# Patient Record
Sex: Male | Born: 1985
Health system: Southern US, Community
[De-identification: ages and names within clinical notes are randomized; demographics above are authoritative.]

---
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
## 2006-04-28 ENCOUNTER — Ambulatory Visit: Payer: Self-pay | Admitting: Internal Medicine

## 2008-05-28 ENCOUNTER — Emergency Department (HOSPITAL_COMMUNITY): Admission: EM | Admit: 2008-05-28 | Discharge: 2008-05-28 | Payer: Self-pay | Admitting: Emergency Medicine

## 2009-09-20 ENCOUNTER — Ambulatory Visit: Payer: Self-pay | Admitting: Internal Medicine

## 2009-09-20 DIAGNOSIS — F5089 Other specified eating disorder: Secondary | ICD-10-CM | POA: Insufficient documentation

## 2009-09-20 DIAGNOSIS — M26609 Unspecified temporomandibular joint disorder, unspecified side: Secondary | ICD-10-CM | POA: Insufficient documentation

## 2009-09-26 LAB — CONVERTED CEMR LAB
Basophils Absolute: 0 10*3/uL (ref 0.0–0.1)
Basophils Relative: 0.4 % (ref 0.0–3.0)
Eosinophils Absolute: 0.1 10*3/uL (ref 0.0–0.7)
Eosinophils Relative: 1.5 % (ref 0.0–5.0)
HCT: 42.6 % (ref 39.0–52.0)
Hemoglobin: 14.6 g/dL (ref 13.0–17.0)
Iron: 148 ug/dL (ref 42–165)
Lymphocytes Relative: 32.9 % (ref 12.0–46.0)
Lymphs Abs: 1.8 10*3/uL (ref 0.7–4.0)
MCHC: 34.3 g/dL (ref 30.0–36.0)
MCV: 98.2 fL (ref 78.0–100.0)
Monocytes Absolute: 0.6 10*3/uL (ref 0.1–1.0)
Monocytes Relative: 10.9 % (ref 3.0–12.0)
Neutro Abs: 3 10*3/uL (ref 1.4–7.7)
Neutrophils Relative %: 54.3 % (ref 43.0–77.0)
Platelets: 262 10*3/uL (ref 150.0–400.0)
RBC: 4.34 M/uL (ref 4.22–5.81)
RDW: 12.1 % (ref 11.5–14.6)
Saturation Ratios: 47.4 % (ref 20.0–50.0)
Transferrin: 223.2 mg/dL (ref 212.0–360.0)
WBC: 5.5 10*3/uL (ref 4.5–10.5)

## 2017-03-28 DIAGNOSIS — H6123 Impacted cerumen, bilateral: Secondary | ICD-10-CM | POA: Diagnosis not present

## 2017-03-28 DIAGNOSIS — J029 Acute pharyngitis, unspecified: Secondary | ICD-10-CM | POA: Diagnosis not present

## 2017-06-24 ENCOUNTER — Ambulatory Visit (INDEPENDENT_AMBULATORY_CARE_PROVIDER_SITE_OTHER): Payer: 59 | Admitting: Family Medicine

## 2017-06-24 ENCOUNTER — Encounter: Payer: Self-pay | Admitting: Family Medicine

## 2017-06-24 VITALS — BP 112/58 | Temp 98.0°F | Ht 72.5 in | Wt 182.0 lb

## 2017-06-24 DIAGNOSIS — Z0000 Encounter for general adult medical examination without abnormal findings: Secondary | ICD-10-CM

## 2017-06-24 NOTE — Patient Instructions (Signed)
WE NOW OFFER   [facility] FAST TRACK!!!  SAME DAY Appointments for ACUTE CARE  Such as: Sprains, Injuries, cuts, abrasions, rashes, muscle pain, joint pain, back pain Colds, flu, sore throats, headache, allergies, cough, fever  Ear pain, sinus and eye infections Abdominal pain, nausea, vomiting, diarrhea, upset stomach Animal/insect bites  3 Easy Ways to Schedule: Walk-In Scheduling Call in scheduling Mychart Sign-up: [email or web address]         

## 2017-06-24 NOTE — Progress Notes (Signed)
   Subjective:    Patient ID: Paul Strong, male    DOB: 1986-08-01, 31 y.o.   MRN: [id number]  HPI 31 yr old male to establish and for a well exam. He feels great. 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    Review of Systems  Constitutional: Negative.   HENT: Negative.   Eyes: Negative.   Respiratory: Negative.   Cardiovascular: Negative.   Gastrointestinal: Negative.   Genitourinary: Negative.   Musculoskeletal: Negative.   Skin: Negative.   Neurological: Negative.   Psychiatric/Behavioral: Negative.        Objective:   Physical Exam  Constitutional: He is oriented to person, place, and time. He appears well-developed and well-nourished. No distress.  HENT:  Head: Normocephalic and atraumatic.  Right Ear: External ear normal.  Left Ear: External ear normal.  Nose: Nose normal.  Mouth/Throat: Oropharynx is clear and moist. No oropharyngeal exudate.  Eyes: Pupils are equal, round, and reactive to light. Conjunctivae and EOM are normal. Right eye exhibits no discharge. Left eye exhibits no discharge. No scleral icterus.  Neck: Neck supple. No JVD present. No tracheal deviation present. No thyromegaly present.  Cardiovascular: Normal rate, regular rhythm, normal heart sounds and intact distal pulses.  Exam reveals no gallop and no friction rub.   No murmur heard. Pulmonary/Chest: Effort normal and breath sounds normal. No respiratory distress. He has no wheezes. He has no rales. He exhibits no tenderness.  Abdominal: Soft. Bowel sounds are normal. He exhibits no distension and no mass. There is no tenderness. There is no rebound and no guarding.  Genitourinary: Rectum normal, prostate normal and penis normal. Rectal exam shows guaiac negative stool. No penile tenderness.  Musculoskeletal: Normal range of motion. He exhibits no edema or tenderness.  Lymphadenopathy:    He has no cervical adenopathy.  Neurological: He is alert and oriented to person,  place, and time. He has normal reflexes. No cranial nerve deficit. He exhibits normal muscle tone. Coordination normal.  Skin: Skin is warm and dry. No rash noted. He is not diaphoretic. No erythema. No pallor.  Psychiatric: He has a normal mood and affect. His behavior is normal. Judgment and thought content normal.          Assessment & Plan:  Well exam. We discussed diet and exercise. Set up fasting labs soon.  Gershon Crane, MD

## 2017-09-02 ENCOUNTER — Other Ambulatory Visit (INDEPENDENT_AMBULATORY_CARE_PROVIDER_SITE_OTHER): Payer: 59

## 2017-09-02 DIAGNOSIS — Z0000 Encounter for general adult medical examination without abnormal findings: Secondary | ICD-10-CM

## 2017-09-02 LAB — CBC WITH DIFFERENTIAL/PLATELET
BASOS ABS: 0 10*3/uL (ref 0.0–0.1)
Basophils Relative: 0.6 % (ref 0.0–3.0)
Eosinophils Absolute: 0.1 10*3/uL (ref 0.0–0.7)
Eosinophils Relative: 1.4 % (ref 0.0–5.0)
HCT: 44.7 % (ref 39.0–52.0)
Hemoglobin: 15 g/dL (ref 13.0–17.0)
LYMPHS ABS: 1.5 10*3/uL (ref 0.7–4.0)
Lymphocytes Relative: 26.4 % (ref 12.0–46.0)
MCHC: 33.6 g/dL (ref 30.0–36.0)
MCV: 95.1 fl (ref 78.0–100.0)
MONOS PCT: 11.5 % (ref 3.0–12.0)
Monocytes Absolute: 0.6 10*3/uL (ref 0.1–1.0)
NEUTROS PCT: 60.1 % (ref 43.0–77.0)
Neutro Abs: 3.4 10*3/uL (ref 1.4–7.7)
Platelets: 312 10*3/uL (ref 150.0–400.0)
RBC: 4.7 Mil/uL (ref 4.22–5.81)
RDW: 12.9 % (ref 11.5–15.5)
WBC: 5.7 10*3/uL (ref 4.0–10.5)

## 2017-09-02 LAB — POC URINALSYSI DIPSTICK (AUTOMATED)
BILIRUBIN UA: NEGATIVE
Blood, UA: NEGATIVE
Glucose, UA: NEGATIVE
Ketones, UA: NEGATIVE
LEUKOCYTES UA: NEGATIVE
NITRITE UA: NEGATIVE
PH UA: 6 (ref 5.0–8.0)
PROTEIN UA: NEGATIVE
Spec Grav, UA: 1.025 (ref 1.010–1.025)
Urobilinogen, UA: 0.2 E.U./dL

## 2017-09-02 LAB — HEPATIC FUNCTION PANEL
ALBUMIN: 4.4 g/dL (ref 3.5–5.2)
ALT: 34 U/L (ref 0–53)
AST: 29 U/L (ref 0–37)
Alkaline Phosphatase: 55 U/L (ref 39–117)
BILIRUBIN DIRECT: 0.1 mg/dL (ref 0.0–0.3)
BILIRUBIN TOTAL: 0.5 mg/dL (ref 0.2–1.2)
Total Protein: 7 g/dL (ref 6.0–8.3)

## 2017-09-02 LAB — BASIC METABOLIC PANEL
BUN: 21 mg/dL (ref 6–23)
CALCIUM: 9.9 mg/dL (ref 8.4–10.5)
CHLORIDE: 103 meq/L (ref 96–112)
CO2: 29 mEq/L (ref 19–32)
CREATININE: 1.08 mg/dL (ref 0.40–1.50)
GFR: 85.15 mL/min (ref >60.00)
Glucose, Bld: 86 mg/dL (ref 70–99)
Potassium: 4.4 mEq/L (ref 3.5–5.1)
Sodium: 140 mEq/L (ref 135–145)

## 2017-09-02 LAB — LIPID PANEL
CHOL/HDL RATIO: 3
Cholesterol: 162 mg/dL (ref 0–200)
HDL: 58.2 mg/dL (ref >39.00)
LDL CALC: 92 mg/dL (ref 0–99)
NONHDL: 103.55
TRIGLYCERIDES: 57 mg/dL (ref 0.0–149.0)
VLDL: 11.4 mg/dL (ref 0.0–40.0)

## 2017-09-02 LAB — TSH: TSH: 1.03 u[IU]/mL (ref 0.35–4.50)

## 2018-03-31 ENCOUNTER — Encounter: Payer: Self-pay | Admitting: Sports Medicine

## 2018-03-31 ENCOUNTER — Ambulatory Visit: Payer: 59 | Admitting: Sports Medicine

## 2018-03-31 ENCOUNTER — Ambulatory Visit
Admission: RE | Admit: 2018-03-31 | Discharge: 2018-03-31 | Disposition: A | Discharge status: Home / Self Care | Payer: 59 | Source: Ambulatory Visit | Attending: Sports Medicine | Admitting: Sports Medicine

## 2018-03-31 VITALS — BP 132/72 | Ht 72.0 in | Wt 180.0 lb

## 2018-03-31 DIAGNOSIS — M25552 Pain in left hip: Secondary | ICD-10-CM

## 2018-03-31 NOTE — Progress Notes (Signed)
   Subjective:    Patient ID: Paul Strong, male    DOB: Nov 21, 1985, 32 y.o.   MRN: [id number]  HPI Pt normally does squats and lunges as part of his lifting routine. He was squatting 200lbs in an effort to get to 225lbs by summer and thinks he overdid it. No obvious distinct injury but he has been having L groin pain in the crease of his leg for the past 8-10 weeks and has stopped working out as a result due to pain. He has pain with certain mvmts, particularly when he has his left leg forward and is pushing off with his L foot. He also has pain when he squeezes his legs together. No pain radiation to abdomen or testicles or down his leg. No meds or ice tried. No pain currently but 3/10 pain with certain mvmts which is improved from 6/10. He usually weight trains 3x/wk and runs once a week but he has had to take a 2 month break due to pain.    Review of Systems As above    Objective:   Physical Exam  Well-developed, well-nourished. No acute distress.Awake alert and oriented 3. Vital signs reviewed  Left hip: Smooth painless hip range of motion with a negative logroll. Negative Faber. Negative FADIR. Patient is tender to palpation at the origin of the common abductor tendon with reproducible pain with resisted left leg adduction. No palpable defect. Good strength otherwise.Neurovascular intact distally. Walking without a limp.       Assessment & Plan:  L adductor strain with possible partial tear Pt sent for XR L hip and pelvis to rule out bony abnormality. Will call with results.  Given adductor eccentric strengthening exercises with 3 different resistance bands. Told he can try light weight open chain exercises such as leg extensions and hamstring curls. F/u in office here in 1 month. If symptoms persist, consider further diagnostic imaging.  Addendum: X-rays of the left hip and pelvis are unremarkable.

## 2018-04-01 ENCOUNTER — Encounter: Payer: Self-pay | Admitting: Sports Medicine

## 2018-04-28 ENCOUNTER — Ambulatory Visit: Payer: 59 | Admitting: Sports Medicine

## 2018-04-28 ENCOUNTER — Encounter: Payer: Self-pay | Admitting: Sports Medicine

## 2018-04-28 VITALS — BP 146/91 | Ht 73.0 in | Wt 180.0 lb

## 2018-04-28 DIAGNOSIS — M7542 Impingement syndrome of left shoulder: Secondary | ICD-10-CM | POA: Diagnosis not present

## 2018-04-28 DIAGNOSIS — M25512 Pain in left shoulder: Secondary | ICD-10-CM

## 2018-04-28 NOTE — Progress Notes (Signed)
History was provided by the patient.  Paul Strong is a 32 y.o. male who is here for f/u of left hip pain and new left shoulder pain.     HPI:  Paul Strong is here for follow up of left groin pain and for new left shoulder pain. Reprots left groin pain resolved 2-3 weeks after last sports med visit after he followed the exercises as recommended. Would like his left shoulder evaluated. Believes pain started 3 to 4 months ago, perhaps a little before his groin pain started. Denies knowing of a single acute injury to shoulder. Describes as throbbing pain "deep in the joint" which he only feels when he is doing weight training >30lbs: forward presses and lfits and overhead lifting.  No problems with flat bench presses. Is right handed, but his left arm is usually stronger. No numbness, tingling, or weakness in his left arm or hand. No neck pain. No hx of previous shoulder injury. Played lacrosse in high school. Likes to play golf. No treatment tried for current pain. No OTC or other meds.  Patient was last seen in sports medicine on 03/31/2018 where he was having left groin pain after increasing squatting routine.  Was diagnosed with a left hip adductor strain with possible partial tear at that time and given home exercise size program.  Review of systems:  As stated above   Interval past medical history, surgical history, family history, and social history obtained and reviewed.  Patient Active Problem List   Diagnosis Date Noted  . PICA 09/20/2009  . TMJ SYNDROME 09/20/2009    Physical Exam:  BP (!) 146/91   Ht 6\' 1"  (1.854 m)   Wt 180 lb (81.6 kg)   BMI 23.75 kg/m     Physical Exam Physical exam: Vital signs are reviewed and are documented in the chart Gen.: Alert, oriented, appears stated age, in no apparent distress HEENT: Moist oral mucosa Respiratory: Normal respirations, able to speak in full sentences Cardiac: Regular rate, distal pulses 2+ Integumentary: No rashes on  visible skin Neurologic: Rotator cuff strength intact bilaterally and symmetric. Upper extremity strength 5/5 throughout. Sensation intact. Psych: Normal affect, mood is described as good Musculoskeletal: Shoulder exam unremarkable bilaterally.  Inspection of both shoulders reveals no obvious deformity or muscle atrophy, warmth, erythema, ecchymosis, or effusion.  No tenderness to palpation over bicipital groove, rotator cuff insertion, cervical spine, scapular spine, distal clavicle, or AC joint.  Full range of motion in shoulder joints bilaterally. No pain with internal or external rotation. Normal grip strength.  Special tests:  Hawkins: negative  Neers: negative  O'Brian: negative  Cross arm: negative  Yergason's: negative  Speeds: negative  Empty can: negative  No imaging today.  Assessment/Plan: Paul Strong is a [age]yr old healthy male previously seen in sports med for left groin pain, and here today with left shoulder pain without acute injury x 3-4 months. History, location of pain, and current unremarkable shoulder exam are most consistent with mild impingement likely due to muscular imbalance around rotator cuff secondary to weight training. No signs of shoulder instability and rotator cuff tear is unlikely. No imaging required today.  1) Left shoulder pain -recommended exercises for scapular stabilization and rotator cuff strengthening -avoid excessive overhead lifting. Okay to continue weight training in gym but avoid painful movements -ok to take OTC NSAID PRN  2) Left groin pain- Resolved  Follow up: As needed for new or worsening symptoms   Annell GreeningPaige Nhi Butrum, MD, MS [facility] PGY3  Patient seen and evaluated with the resident. I agree with the above plan of care. Groin pain has resolved. Patient now complaining of left shoulder pain which is minimally painful. It is not affecting his activities.Although his physical exam is fairly unremarkable, his history  suggests rotator cuff impingement as the source of his symptoms. Jobe home exercise program and scapular stabilization exercises were provided. Patient may need to avoid overhead lifting for the next several weeks to allow for his pain to resolve. If symptoms persist or worsen he will return to the office for reevaluation and consideration of MSK ultrasound. Follow-up for ongoing or recalcitrant issues.

## 2018-04-28 NOTE — Patient Instructions (Signed)
Thanks for coming to see Dr. Margaretha Sheffieldraper. We recommend the following for your shoulder pain:  -Do exercises with resistance band that we reviewed in clinic -Avoid excessive overhead lifting or activities that increase pain while in the gym -Follow-up in sports medicine as needed for new or worsening symptoms

## 2018-11-05 ENCOUNTER — Ambulatory Visit: Payer: 59 | Admitting: Sports Medicine

## 2018-11-05 ENCOUNTER — Encounter: Payer: Self-pay | Admitting: Sports Medicine

## 2018-11-05 VITALS — BP 132/82 | Ht 72.0 in | Wt 180.0 lb

## 2018-11-05 DIAGNOSIS — M79671 Pain in right foot: Secondary | ICD-10-CM

## 2018-11-05 DIAGNOSIS — M25522 Pain in left elbow: Secondary | ICD-10-CM

## 2018-11-05 NOTE — Progress Notes (Signed)
Paul Strong - 33 y.o. male MRN [id number]  Date of birth: [birth year]    SUBJECTIVE:      Chief Complaint:/ HPI:  Patient presents today with complaint of left elbow pain and right foot pain.  Left elbow pain: 9 months of posterior medial elbow pain.  This began somewhat related to golfing and striking the ground w club.  Since then he has had intermittent pain with pressing movements when fully extending at the elbow and with swinging a golf club.  He does have a remote history of fracture in the area from a snowboarding accident.  He his pain is being sharp and is aggravated.  He denies any associated swelling or erythema.  No radiating numbness tingling or pain into the hand.  He has not taken any medications.  No recent x-rays.  Right foot pain: 6 months of dorsal lateral foot pain after running.  He denies any second previous injuries other than ankle sprains in the past.  He is able to run but notes pain after this.  He reports having flat feet and wears rigid orthotics in his dress shoes for this.  He does not wear orthotics in his running shoes.  He denies any localized erythema or swelling over the lateral foot.  No associated numbness or tingling.  No associated skin changes   ROS:     See HPI  PERTINENT  PMH / PSH FH / / SH:  Past Medical, Surgical, Social, and Family History Reviewed & Updated in the EMR.    OBJECTIVE: BP 132/82   Ht 6' (1.829 m)   Wt 180 lb (81.6 kg)   BMI 24.41 kg/m   Physical Exam:  Vital signs are reviewed.  GEN: Alert and oriented, NAD Pulm: Breathing unlabored PSY: normal mood, congruent affect  MSK: Left elbow: No obvious deformity, swelling, or erythema Mild very focal tenderness over the medial joint/cubital tunnel He does have slightly decreased extension of the elbow compared to the left however he states this is been present since the fracture 5/5 strength with elbow flexion and extension without pain N/V intact distally Negative  Tinel's at the cubital tunnel.  No pain or gapping with milking maneuver  MSK US:  Limited ultrasound evaluation of the left elbow.   No abnormalities over the lateral epicondyle.   No abnormalities over the medial epicondyle.   The distal insertion of the triceps is unremarkable.   The UCL is intact without tears.  Ulnar nerve visualized within the cubital tunnel.  Alongside this, there is an approximately 4 mm calcification with surrounding edema.  This is likely from his previous elbow fracture.  He is focally tender overlying this calcification.  Impression: remote fracture proximal ulna with loose fragments in cubital tunnel  Ultrasound and interpretation by Dalbert GarnetJerome Arcelia Pals, DO and  Sibyl ParrKarl B. Fields, MD   Right foot: Inspection:  No obvious bony deformity.  No swelling, erythema, or bruising.  Bilateral pes planus.  Left worse than right Palpation: Tenderness over the peroneal tendons just distal to the lateral malleolus ROM: Full  ROM of the ankle. Normal midfoot flexibility Strength: 5/5 strength in all planes Neurovascular: N/V intact distally in the lower extremity Special tests: Normal calcaneal motion with heel raise  MSK US:  Limited ultrasound of the lateral foot.   Peroneus brevis and longus visualized.  There is slight swelling surrounding the peroneus brevis as it traverses posterior to the lateral malleolus.   Patient is tender just distal to this point  along the peroneal tendons.   There is evidence of early bisection of the peroneus longus tendon  Impression: no peroneal tendon tear or tenosynovitis noted  Ultrasound and interpretation by Dalbert GarnetJerome Dmiyah Liscano, DO and Sibyl ParrKarl B. Fields, MD     ASSESSMENT & PLAN:  1.  Left elbow pain-patient is focally tender over the visualized calcification.  This is likely remnant of his previous elbow fracture.  There is surrounding edema and corresponding tenderness.  At this time, there is no evidence of ulnar nerve entrapment. -NSAIDs  as needed - Ice massage.  Patient cautioned regarding prolonged icing of the medial elbow in relation to the ulnar nerve - Patient shown elbow sleeves the may help with his symptoms  2.  Right lateral foot pain- pain secondary to peroneal tendinopathy.  Patient was fit with green Hapad orthotics size 12.5.  If these improve his symptoms with running, consider custom orthotics. -He will follow-up in 4 to 6 weeks if needed  I observed and examined the patient with the [facility] and agree with assessment and plan.  Note reviewed and modified by me. Sterling BigKB Fields, MD

## 2018-12-18 IMAGING — DX DG HIP (WITH OR WITHOUT PELVIS) 2-3V*L*
2 series · 2 of 2 positions shown · non-contrast
Comparison: None.

CLINICAL DATA: Left hip pain. No known injury or prior relevant
surgery.

EXAM:
DG HIP (WITH OR WITHOUT PELVIS) 2-3V LEFT

[dg hip unilat w or w/o pelvis 2-3 views  (1 of 2)]
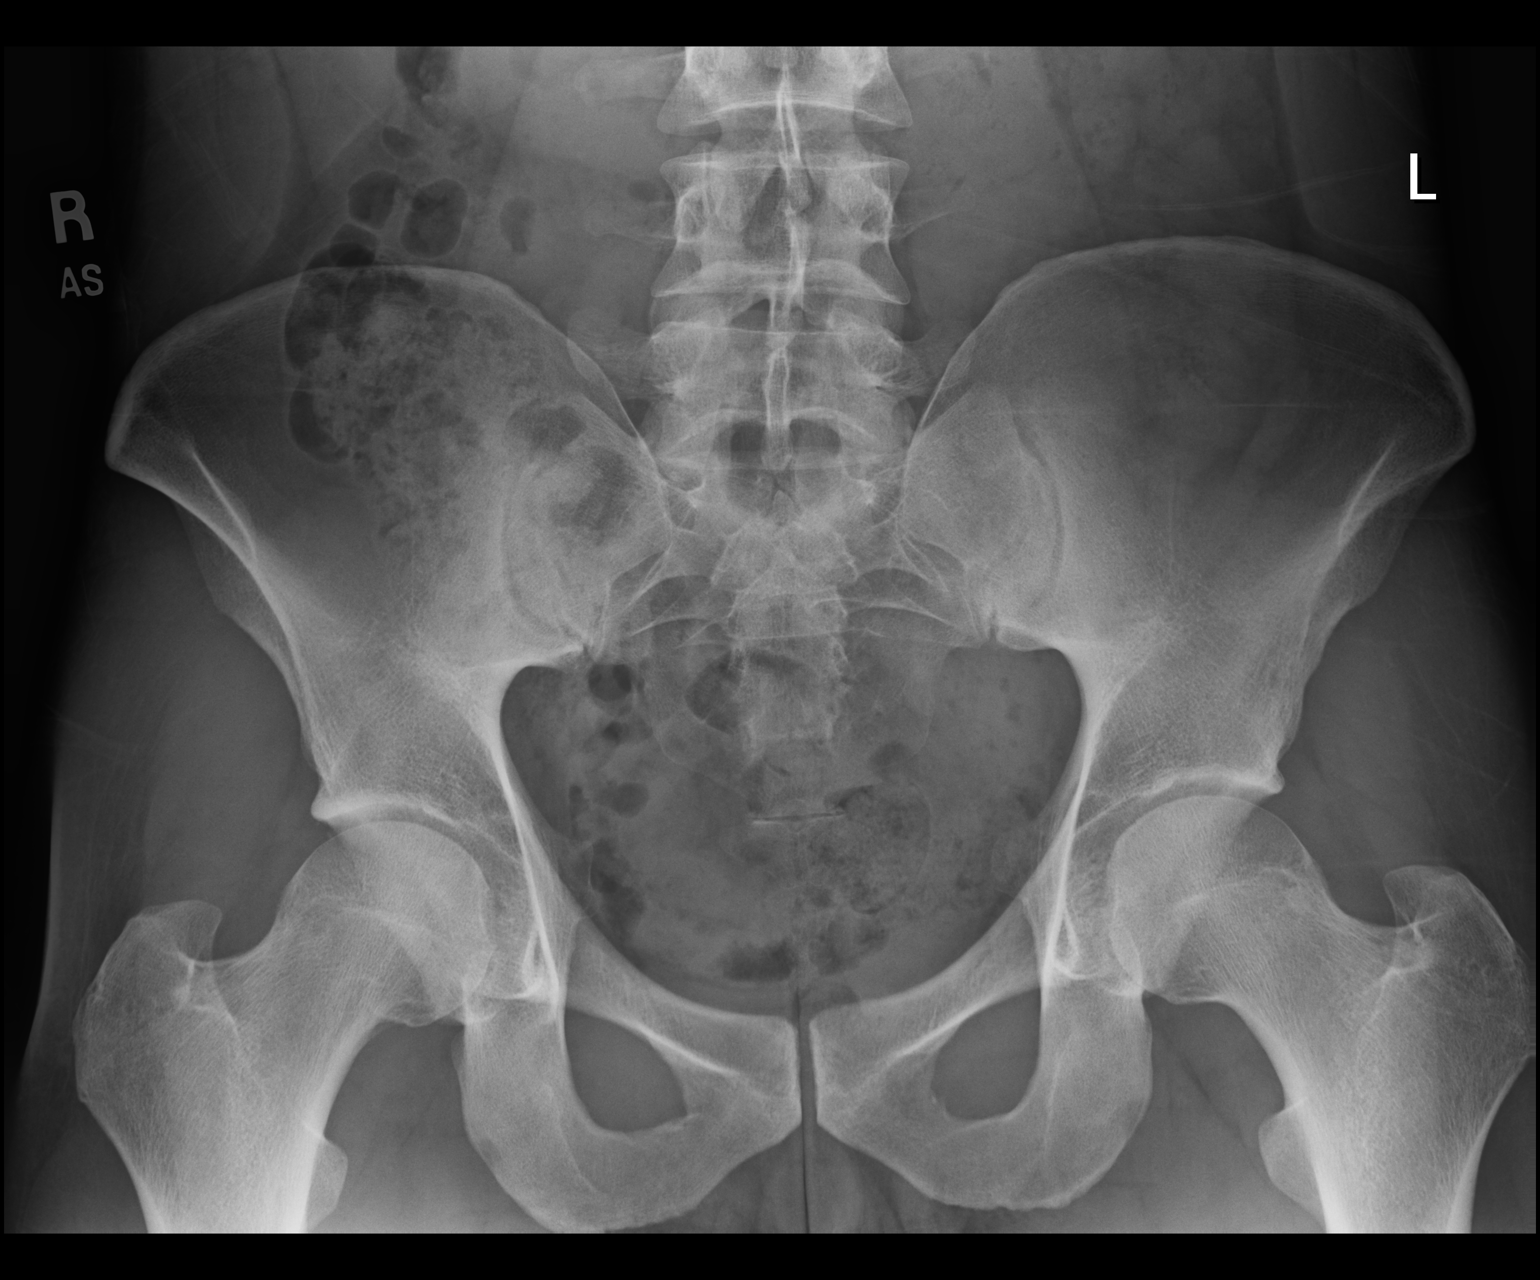

[dg hip unilat w or w/o pelvis 2-3 views  (2 of 2)]
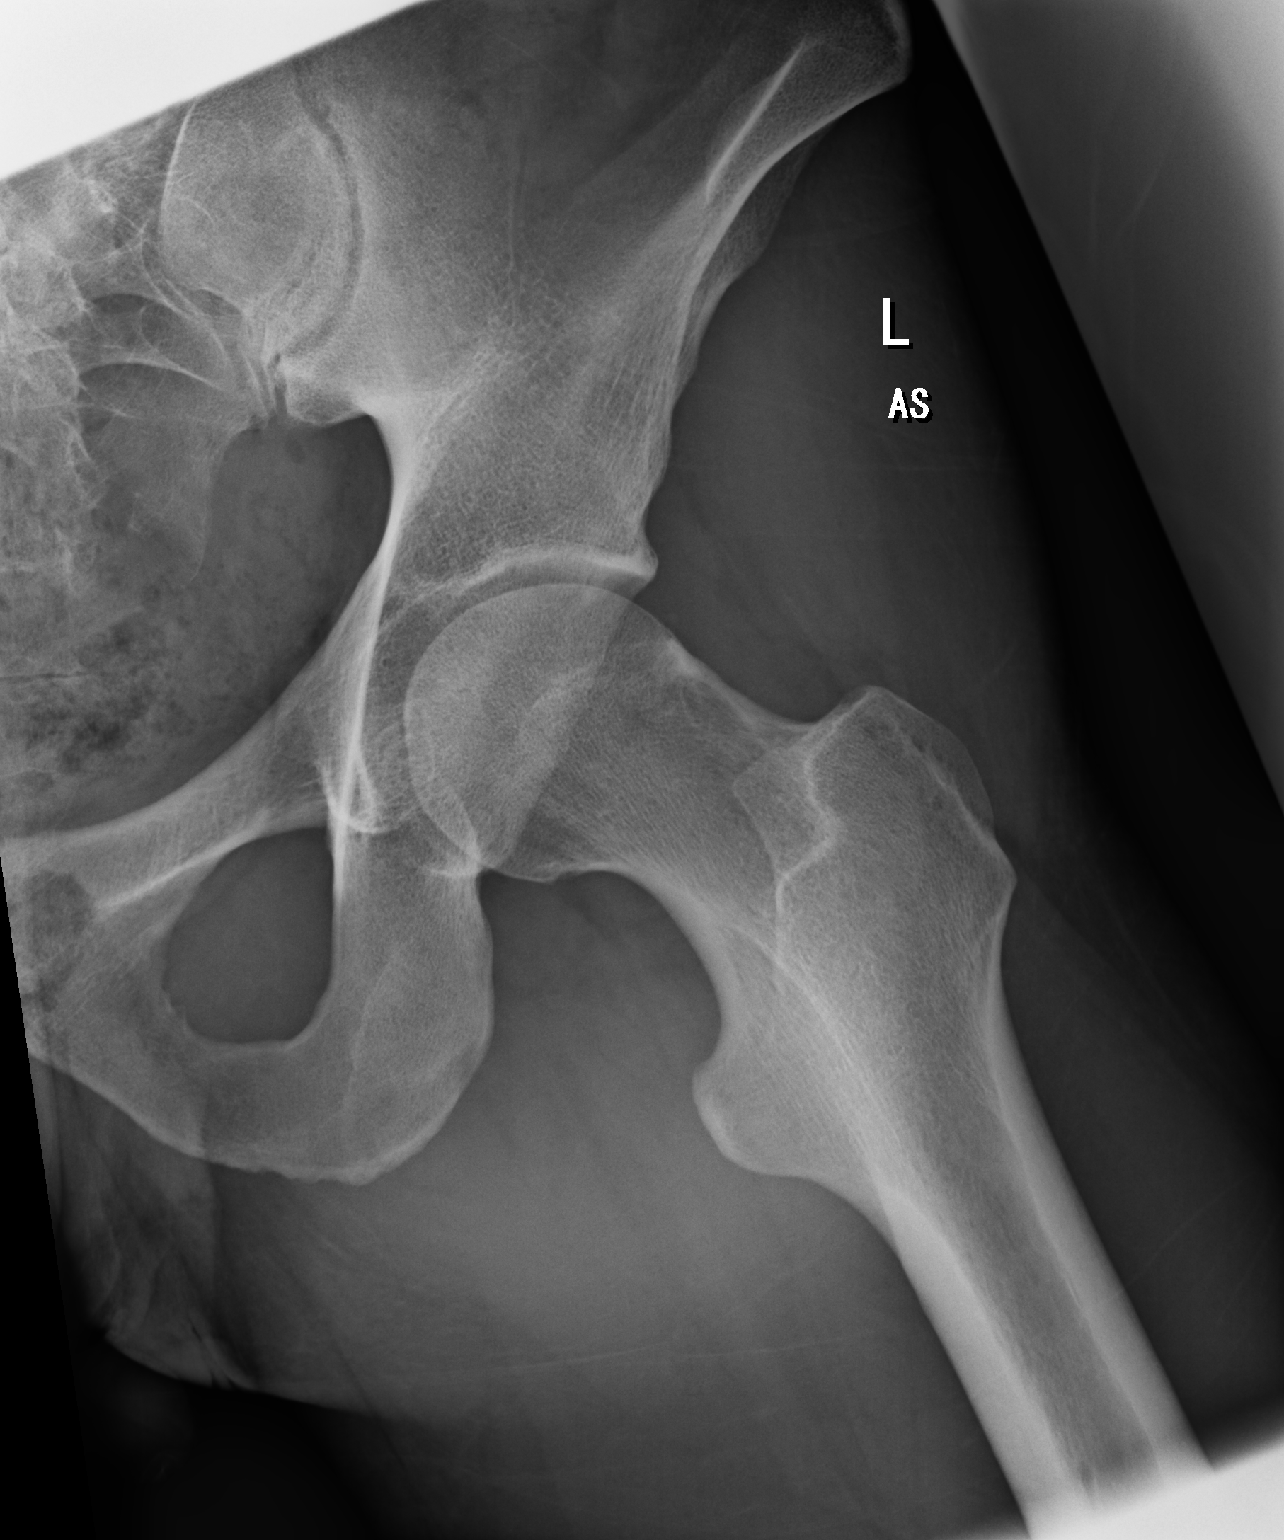

[2 of 2 positions shown; findings below may reference images not displayed]

FINDINGS: AP pelvis and frog-leg lateral view of the left hip. The
mineralization and alignment are normal. There is no evidence of
acute fracture or dislocation. No evidence of femoral head avascular
necrosis or significant hip arthropathy. The sacroiliac joints
appear normal. No focal soft tissue abnormalities are seen.
IMPRESSION: No acute osseous findings or significant arthropathic changes.

## 2018-12-31 DIAGNOSIS — S5001XA Contusion of right elbow, initial encounter: Secondary | ICD-10-CM | POA: Diagnosis not present

## 2019-01-05 ENCOUNTER — Ambulatory Visit: Payer: 59 | Admitting: Family Medicine

## 2019-05-03 ENCOUNTER — Telehealth: Payer: Self-pay | Admitting: Family Medicine

## 2019-05-03 NOTE — Telephone Encounter (Signed)
Pt reports girlfriend tested positive for covid 34 mid June and quarantined for 2 weeks.  States she tested negative 04/26/2019 and he saw her 04/30/2019.  Pt is asymptomatic. Requesting testing if advised by Dr. Sarajane Jews.  CB# [phone]

## 2019-05-04 NOTE — Telephone Encounter (Signed)
At this point I do not advise him to be tested unless something changes

## 2019-05-11 ENCOUNTER — Ambulatory Visit: Payer: 59 | Admitting: Sports Medicine

## 2019-05-11 ENCOUNTER — Other Ambulatory Visit: Payer: Self-pay

## 2019-05-11 ENCOUNTER — Ambulatory Visit
Admission: RE | Admit: 2019-05-11 | Discharge: 2019-05-11 | Disposition: A | Discharge status: Home / Self Care | Payer: 59 | Source: Ambulatory Visit | Attending: Sports Medicine | Admitting: Sports Medicine

## 2019-05-11 VITALS — BP 122/68 | Ht 72.0 in | Wt 175.0 lb

## 2019-05-11 DIAGNOSIS — M25521 Pain in right elbow: Secondary | ICD-10-CM | POA: Diagnosis not present

## 2019-05-12 ENCOUNTER — Encounter: Payer: Self-pay | Admitting: Sports Medicine

## 2019-05-12 NOTE — Progress Notes (Signed)
   Subjective:    Patient ID: Paul Strong, male    DOB: Aug 18, 1986, 33 y.o.   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  Patient has footage on his iPhone.  While snowboarding he fell backwards landing on an outstretched hand and injuring the right elbow.  He denies any swelling at that time but since then he has had persistent, intermittent pain that he localizes to the lateral elbow.  He notices it most when the elbow is in full extension.  He also has difficulty at night if he sleeps with the elbow in a deeply flexed position for a period of time.  He does report intermittent popping and catching in the elbow which is painful at times but at other times alleviates his pain.  He denies any prior injuries to the right elbow but does have a history of a previous left elbow fracture which was treated conservatively.  He denies numbness or tingling into the forearm or hand.  He has not had any specific treatment to date.  Patient did have x-rays at [facility] urgent [facility] immediately upon his return to [facility] back in February right after his accident and those x-rays are negative per his report.  Interim medical history reviewed Medications reviewed Allergies reviewed   Review of Systems As above    Objective:   Physical Exam  Well-developed, well-nourished.  No acute distress.  Awake alert and oriented x3.  Vital signs reviewed.  Right elbow: Full range of motion.  No effusion.  No soft tissue swelling.  Patient is tender to palpation along the lateral joint space.  There is also some mild reproducible pain with active and passive pronation and supination.  No tenderness to palpation over the lateral epicondyle and no pain with resisted ECRB testing.  No tenderness over the olecranon.  No tenderness over the medial  epicondyle.  Elbow is stable to ligamentous exam.  Biceps tendon is intact.  Good strength.  Neurovascularly intact distally.  X-rays of the right elbow including AP and lateral views are unremarkable.      Assessment & Plan:   Chronic right elbow pain status post snowboarding injury  Although x-rays are reassuring, I cannot rule out intra-articular pathology such as an osteochondral defect which he may have suffered at the time of his injury.  I recommended an MRI of the elbow to evaluate further.  Patient will check with his insurance company first and I provided him with a list of local imaging facilities.  Patient will call us when he is ready to pursue MRI.  In the meantime, I have given him a body helix compression sleeve to wear on his elbow when active.  We will delineate further treatment based on MRI findings.  I will call him with those results when available.  This note was dictated using Dragon naturally speaking software and may contain errors in syntax, spelling, or content which have not been identified prior to signing this note.

## 2019-06-01 ENCOUNTER — Other Ambulatory Visit: Payer: Self-pay

## 2019-06-01 DIAGNOSIS — Z20822 Contact with and (suspected) exposure to covid-19: Secondary | ICD-10-CM

## 2019-06-02 LAB — NOVEL CORONAVIRUS, NAA: SARS-CoV-2, NAA: NOT DETECTED

## 2019-08-16 ENCOUNTER — Other Ambulatory Visit: Payer: Self-pay

## 2019-08-16 ENCOUNTER — Ambulatory Visit (INDEPENDENT_AMBULATORY_CARE_PROVIDER_SITE_OTHER): Payer: 59 | Admitting: Family Medicine

## 2019-08-16 ENCOUNTER — Ambulatory Visit: Payer: 59 | Admitting: Family Medicine

## 2019-08-16 ENCOUNTER — Encounter: Payer: Self-pay | Admitting: Family Medicine

## 2019-08-16 DIAGNOSIS — R0602 Shortness of breath: Secondary | ICD-10-CM | POA: Diagnosis not present

## 2019-08-16 NOTE — Progress Notes (Signed)
Virtual Visit via Video Note  I connected with Paul Strong on 08/16/19 at  3:00 PM EDT by a video enabled telemedicine application 2/2 [id number] pandemic and verified that I am speaking with the correct person using two identifiers.  Location patient: home Location provider:work or home office Persons participating in the virtual visit: patient, provider  I discussed the limitations of evaluation and management by telemedicine and the availability of in person appointments. The patient expressed understanding and agreed to proceed.   HPI:  Pt is a 33 yo male with pmh sig for TMJ and h/o PICA.  Pt notes SOB after heavy work out since over the summer.  Pt states he will continue to have SOB for several hours.  Pt also notes his voice goes up higher during this time (has recordings).  Pt denies personal or family history of asthma.  Patient denies seasonal allergies or GERD symptoms.  Patient also notes voice will start off low then go higher after all day wearing a facemask.  Patient denies coughing, wheezing, CP, dizziness, taking medications, tobacco use, anxiety.   ROS: See pertinent positives and negatives per HPI.  No past medical history on file.  Past Surgical History:  Procedure Laterality Date  . APPENDECTOMY  2001    Family History  Problem Relation Age of Onset  . Atrial fibrillation Father     No current outpatient medications on file.  EXAM:  VITALS per patient if applicable:  GENERAL: alert, oriented, appears well and in no acute distress  HEENT: atraumatic, conjunctiva clear, no obvious abnormalities on inspection of external nose and ears  NECK: normal movements of the head and neck  LUNGS: on inspection no signs of respiratory distress, breathing rate appears normal, no obvious gross SOB, gasping or wheezing  CV: no obvious cyanosis  MS: moves all visible extremities without noticeable abnormality  PSYCH/NEURO: pleasant and cooperative, no obvious  depression or anxiety, speech and thought processing grossly intact  ASSESSMENT AND PLAN:  Discussed the following assessment and plan:  SOB (shortness of breath) on exertion  -discussed possible causes -will obtain PFTs to r/o asthma - Plan: Ambulatory referral to Pulmonology  F/u prn   I discussed the assessment and treatment plan with the patient. The patient was provided an opportunity to ask questions and all were answered. The patient agreed with the plan and demonstrated an understanding of the instructions.   The patient was advised to call back or seek an in-person evaluation if the symptoms worsen or if the condition fails to improve as anticipated.  Billie Ruddy, MD

## 2020-01-08 ENCOUNTER — Ambulatory Visit: Payer: 59 | Attending: Internal Medicine

## 2020-01-08 DIAGNOSIS — Z23 Encounter for immunization: Secondary | ICD-10-CM

## 2020-01-08 NOTE — Progress Notes (Signed)
   Covid-19 Vaccination Clinic  Name:  Dae Highley    MRN: [id number] DOB: 1986-03-05  01/08/2020  Mr. Loux was observed post Covid-19 immunization for 15 minutes without incident. He was provided with Vaccine Information Sheet and instruction to access the V-Safe system.   Mr. Marcotte was instructed to call 911 with any severe reactions post vaccine: Marland Kitchen Difficulty breathing  . Swelling of face and throat  . A fast heartbeat  . A bad rash all over body  . Dizziness and weakness   Immunizations Administered    Name Date Dose VIS Date Route   Pfizer COVID-19 Vaccine 01/08/2020 10:55 AM 0.3 mL 10/08/2019 Intramuscular   Manufacturer: [employer], [employer]   Lot: [id number]   NDC: [address]-5

## 2020-02-02 ENCOUNTER — Ambulatory Visit: Payer: 59

## 2020-02-09 ENCOUNTER — Ambulatory Visit: Payer: 59 | Attending: Internal Medicine

## 2020-02-09 DIAGNOSIS — Z23 Encounter for immunization: Secondary | ICD-10-CM

## 2020-02-09 NOTE — Progress Notes (Signed)
   Covid-19 Vaccination Clinic  Name:  Paul Strong    MRN: [id number] DOB: [birth year]  02/09/2020  Mr. Paul Strong was observed post Covid-19 immunization for 15 minutes without incident. He was provided with Vaccine Information Sheet and instruction to access the V-Safe system.   Mr. Paul Strong was instructed to call 911 with any severe reactions post vaccine: Marland Kitchen Difficulty breathing  . Swelling of face and throat  . A fast heartbeat  . A bad rash all over body  . Dizziness and weakness   Immunizations Administered    Name Date Dose VIS Date Route   Pfizer COVID-19 Vaccine 02/09/2020 12:07 PM 0.3 mL 10/08/2019 Intramuscular   Manufacturer: [employer], [employer]   Lot: [id number]   NDC: [address]-3

## 2020-03-21 ENCOUNTER — Other Ambulatory Visit: Payer: Self-pay

## 2020-03-21 ENCOUNTER — Telehealth (INDEPENDENT_AMBULATORY_CARE_PROVIDER_SITE_OTHER): Payer: 59 | Admitting: Family Medicine

## 2020-03-21 DIAGNOSIS — Z7184 Encounter for health counseling related to travel: Secondary | ICD-10-CM

## 2020-03-21 DIAGNOSIS — J040 Acute laryngitis: Secondary | ICD-10-CM | POA: Diagnosis not present

## 2020-03-21 DIAGNOSIS — R0981 Nasal congestion: Secondary | ICD-10-CM | POA: Diagnosis not present

## 2020-03-21 MED ORDER — AZITHROMYCIN 500 MG PO TABS
ORAL_TABLET | ORAL | 0 refills | Status: DC
Start: 2020-03-21 — End: 2021-09-27

## 2020-03-21 NOTE — Progress Notes (Signed)
Virtual Visit via Video Note  I connected with Eusevio  on 03/21/20 at  3:00 PM EDT by a video enabled telemedicine application and verified that I am speaking with the correct person using two identifiers.  Location patient: home Location provider:work or home office Persons participating in the virtual visit: patient, provider  I discussed the limitations of evaluation and management by telemedicine and the availability of in person appointments. The patient expressed understanding and agreed to proceed.   HPI:  Acute visit for laryngitis: -started 3 days ago -symptoms include a runny nose, hoarse, voice - now improving -denies fevers, SOB, loss of taste, wheezing, NVD, sore throat, difficulty swallowing, history of allergies, sick contacts -did eat out inside in a restaurant in the last few weeks -getting married this weekend -fully vaccinated for COVID19, [employer], last dose was March 18  Request for travel Rx: -traveling to [place] and [place] for [place] -requesting abx to take with him in case gets travelers diarrhea  ROS: See pertinent positives and negatives per HPI.  No past medical history on file.  Past Surgical History:  Procedure Laterality Date  . APPENDECTOMY  2001    Family History  Problem Relation Age of Onset  . Atrial fibrillation Father     SOCIAL HX: see hpi   Current Outpatient Medications:  .  azithromycin (ZITHROMAX) 500 MG tablet, Take 1 tablet daily for 3 days if concerning travelers diarrhea, Disp: 3 tablet, Rfl: 0  EXAM:  VITALS per patient if applicable:  GENERAL: alert, oriented, appears well and in no acute distress  HEENT: atraumatic, conjunttiva clear, no obvious abnormalities on inspection of external nose and ears  NECK: normal movements of the head and neck  LUNGS: on inspection no signs of respiratory distress, breathing rate appears normal, no obvious gross SOB, gasping or wheezing  CV: no obvious cyanosis  MS: moves all  visible extremities without noticeable abnormality  PSYCH/NEURO: pleasant and cooperative, no obvious depression or anxiety, speech and thought processing grossly intact  ASSESSMENT AND PLAN:  Discussed the following assessment and plan:  Nasal congestion  Laryngitis  Travel advice encounter  -we discussed possible serious and likely etiologies, options for evaluation and workup, limitations of telemedicine visit vs in person visit, treatment, treatment risks and precautions. Pt prefers to treat via telemedicine empirically rather then risking or undertaking an in person visit at this moment. Suspect viral etiology for the runny nose and laryngitis. His voice sounded good during the visit and seems to be improving. Discussed symptomatic care. Also, discussed the possibility of COVID19, though much less likely given fully vaccinated. He is considering a test given the wedding and travel are planned and his symptoms. He also requested an antibiotic to take with him in case needs for travelers diarrhea. Discussed it is usually self limited and risks. He still wanted and Rx in case any more severe diarrhea on the trip. Sent to pharmacy. Patient agrees to seek prompt in person care if worsening, new symptoms arise, or if is not improving with treatment.   I discussed the assessment and treatment plan with the patient. The patient was provided an opportunity to ask questions and all were answered. The patient agreed with the plan and demonstrated an understanding of the instructions.   The patient was advised to call back or seek an in-person evaluation if the symptoms worsen or if the condition fails to improve as anticipated.   Terressa Koyanagi, DO

## 2021-08-22 ENCOUNTER — Telehealth: Payer: Self-pay | Admitting: Family Medicine

## 2021-08-22 NOTE — Telephone Encounter (Signed)
Called patient to get him scheduled for an appointment per his triage note from 10/25, 5:23pm. No answer, left voicemail message.  Schedule an appointment for patient once he calls back.

## 2021-08-30 ENCOUNTER — Ambulatory Visit: Payer: 59 | Admitting: Family Medicine

## 2021-09-06 ENCOUNTER — Ambulatory Visit: Payer: 59 | Admitting: Family Medicine

## 2021-09-13 ENCOUNTER — Ambulatory Visit: Payer: 59 | Admitting: Family Medicine

## 2021-09-27 ENCOUNTER — Encounter: Payer: Self-pay | Admitting: Family Medicine

## 2021-09-27 ENCOUNTER — Ambulatory Visit: Payer: 59 | Admitting: Family Medicine

## 2021-09-27 VITALS — BP 130/80 | HR 58 | Temp 97.5°F | Wt 180.0 lb

## 2021-09-27 DIAGNOSIS — I10 Essential (primary) hypertension: Secondary | ICD-10-CM | POA: Diagnosis not present

## 2021-09-27 DIAGNOSIS — Z0000 Encounter for general adult medical examination without abnormal findings: Secondary | ICD-10-CM | POA: Diagnosis not present

## 2021-09-27 LAB — CBC WITH DIFFERENTIAL/PLATELET
Basophils Absolute: 0 10*3/uL (ref 0.0–0.1)
Basophils Relative: 0.6 % (ref 0.0–3.0)
Eosinophils Absolute: 0.1 10*3/uL (ref 0.0–0.7)
Eosinophils Relative: 1.6 % (ref 0.0–5.0)
HCT: 44.5 % (ref 39.0–52.0)
Hemoglobin: 15.4 g/dL (ref 13.0–17.0)
Lymphocytes Relative: 25.7 % (ref 12.0–46.0)
Lymphs Abs: 1.3 10*3/uL (ref 0.7–4.0)
MCHC: 34.5 g/dL (ref 30.0–36.0)
MCV: 91.8 fl (ref 78.0–100.0)
Monocytes Absolute: 0.5 10*3/uL (ref 0.1–1.0)
Monocytes Relative: 10 % (ref 3.0–12.0)
Neutro Abs: 3.2 10*3/uL (ref 1.4–7.7)
Neutrophils Relative %: 62.1 % (ref 43.0–77.0)
Platelets: 276 10*3/uL (ref 150.0–400.0)
RBC: 4.85 Mil/uL (ref 4.22–5.81)
RDW: 13 % (ref 11.5–15.5)
WBC: 5.2 10*3/uL (ref 4.0–10.5)

## 2021-09-27 LAB — LIPID PANEL
Cholesterol: 159 mg/dL (ref 0–200)
HDL: 55.9 mg/dL (ref >39.00)
LDL Cholesterol: 94 mg/dL (ref 0–99)
NonHDL: 103.12
Total CHOL/HDL Ratio: 3
Triglycerides: 44 mg/dL (ref 0.0–149.0)
VLDL: 8.8 mg/dL (ref 0.0–40.0)

## 2021-09-27 LAB — BASIC METABOLIC PANEL
BUN: 21 mg/dL (ref 6–23)
CO2: 31 mEq/L (ref 19–32)
Calcium: 9.8 mg/dL (ref 8.4–10.5)
Chloride: 104 mEq/L (ref 96–112)
Creatinine, Ser: 1.23 mg/dL (ref 0.40–1.50)
GFR: 76.66 mL/min (ref >60.00)
Glucose, Bld: 84 mg/dL (ref 70–99)
Potassium: 4.5 mEq/L (ref 3.5–5.1)
Sodium: 140 mEq/L (ref 135–145)

## 2021-09-27 LAB — HEPATIC FUNCTION PANEL
ALT: 38 U/L (ref 0–53)
AST: 28 U/L (ref 0–37)
Albumin: 4.5 g/dL (ref 3.5–5.2)
Alkaline Phosphatase: 62 U/L (ref 39–117)
Bilirubin, Direct: 0.1 mg/dL (ref 0.0–0.3)
Total Bilirubin: 0.5 mg/dL (ref 0.2–1.2)
Total Protein: 7.4 g/dL (ref 6.0–8.3)

## 2021-09-27 LAB — TSH: TSH: 0.88 u[IU]/mL (ref 0.35–5.50)

## 2021-09-27 LAB — HEMOGLOBIN A1C: Hgb A1c MFr Bld: 5.7 % (ref 4.6–6.5)

## 2021-09-27 MED ORDER — LISINOPRIL 10 MG PO TABS: 10.0000 mg | ORAL_TABLET | Freq: Every day | ORAL | 3 refills | Status: DC

## 2021-09-27 NOTE — Progress Notes (Signed)
Subjective:    Patient ID: Paul Strong, male    DOB: 20-Nov-1985, 35 y.o.   MRN: [id number]  HPI Here for a well exam and with concerns about his BP. He feels fine in general, but he has noticed for the past 3-4 months that he gets mild headaches after he works out at [employer]. Sometimes he gets mild headaches after drinking several cups of coffee. He started checking his BP and he frequently has readings in the 150s over 90s. He does not use tobacco. His weight has been stable. He has tried to reduce the sodium in his diet as well.    Review of Systems  Constitutional: Negative.   HENT: Negative.    Eyes: Negative.   Respiratory: Negative.    Cardiovascular: Negative.   Gastrointestinal: Negative.   Genitourinary: Negative.   Musculoskeletal: Negative.   Skin: Negative.   Neurological:  Positive for headaches.  Psychiatric/Behavioral: Negative.        Objective:   Physical Exam Constitutional:      General: He is not in acute distress.    Appearance: Normal appearance. He is well-developed. He is not diaphoretic.  HENT:     Head: Normocephalic and atraumatic.     Right Ear: External ear normal.     Left Ear: External ear normal.     Nose: Nose normal.     Mouth/Throat:     Pharynx: No oropharyngeal exudate.  Eyes:     General: No scleral icterus.       Right eye: No discharge.        Left eye: No discharge.     Conjunctiva/sclera: Conjunctivae normal.     Pupils: Pupils are equal, round, and reactive to light.  Neck:     Thyroid: No thyromegaly.     Vascular: No JVD.     Trachea: No tracheal deviation.  Cardiovascular:     Rate and Rhythm: Normal rate and regular rhythm.     Heart sounds: Normal heart sounds. No murmur heard.   No friction rub. No gallop.  Pulmonary:     Effort: Pulmonary effort is normal. No respiratory distress.     Breath sounds: Normal breath sounds. No wheezing or rales.  Chest:     Chest wall: No tenderness.  Abdominal:     General:  Bowel sounds are normal. There is no distension.     Palpations: Abdomen is soft. There is no mass.     Tenderness: There is no abdominal tenderness. There is no guarding or rebound.  Genitourinary:    Penis: Normal. No tenderness.      Testes: Normal.  Musculoskeletal:        General: No tenderness. Normal range of motion.     Cervical back: Neck supple.  Lymphadenopathy:     Cervical: No cervical adenopathy.  Skin:    General: Skin is warm and dry.     Coloration: Skin is not pale.     Findings: No erythema or rash.  Neurological:     Mental Status: He is alert and oriented to person, place, and time.     Cranial Nerves: No cranial nerve deficit.     Motor: No abnormal muscle tone.     Coordination: Coordination normal.     Deep Tendon Reflexes: Reflexes are normal and symmetric. Reflexes normal.  Psychiatric:        Behavior: Behavior normal.        Thought Content: Thought content normal.  Judgment: Judgment normal.          Assessment & Plan:  Well exam. We discussed diet and exercise. Get fasting labs. He also has mild HTN now, so we will treat this with Lisinopril 10 mg daily. He will follow up in 3-4 weeks.  Gershon Crane, MD

## 2022-04-24 ENCOUNTER — Other Ambulatory Visit: Payer: Self-pay | Admitting: Family Medicine

## 2022-04-24 DIAGNOSIS — I10 Essential (primary) hypertension: Secondary | ICD-10-CM

## 2022-06-06 ENCOUNTER — Ambulatory Visit: Payer: 59 | Admitting: Family Medicine

## 2022-06-06 ENCOUNTER — Encounter: Payer: Self-pay | Admitting: Family Medicine

## 2022-06-06 VITALS — BP 124/76 | HR 60 | Temp 98.0°F | Ht 72.0 in | Wt 181.0 lb

## 2022-06-06 DIAGNOSIS — G4700 Insomnia, unspecified: Secondary | ICD-10-CM | POA: Diagnosis not present

## 2022-06-06 MED ORDER — ESZOPICLONE 1 MG PO TABS: 1.0000 mg | ORAL_TABLET | Freq: Every evening | ORAL | 5 refills | Status: DC | PRN

## 2022-06-06 NOTE — Progress Notes (Signed)
   Subjective:    Patient ID: Paul Strong, male    DOB: 09/15/1986, 36 y.o.   MRN: [id number]  HPI Here asking for help to sleep. He says he has always been a "light sleeper", but the past few months he has struggled to get a good night's sleep. He is cure that work stressors are playing a role. He exercises 5 days a week. He recently tried one tablet of Lunesta that a friend gave him, and he says he slept all night long and felt "great" the next morning.    Review of Systems  Constitutional: Negative.   Respiratory: Negative.    Cardiovascular: Negative.   Psychiatric/Behavioral:  Positive for sleep disturbance. Negative for dysphoric mood. The patient is not nervous/anxious.        Objective:   Physical Exam Constitutional:      Appearance: Normal appearance.  Cardiovascular:     Rate and Rhythm: Normal rate and regular rhythm.     Pulses: Normal pulses.     Heart sounds: Normal heart sounds.  Pulmonary:     Effort: Pulmonary effort is normal.     Breath sounds: Normal breath sounds.  Neurological:     Mental Status: He is alert.           Assessment & Plan:  Insomnia, he will try Lunesta 1 mg at bedtime. Recheck as needed.  Gershon Crane, MD

## 2022-08-13 ENCOUNTER — Other Ambulatory Visit: Payer: Self-pay | Admitting: Family Medicine

## 2022-08-13 DIAGNOSIS — I10 Essential (primary) hypertension: Secondary | ICD-10-CM

## 2022-12-05 ENCOUNTER — Other Ambulatory Visit: Payer: Self-pay | Admitting: Family Medicine

## 2022-12-06 NOTE — Telephone Encounter (Signed)
Last OV-06/06/2022 Last refill-06/06/22--30 tabs  No future OV scheduled.

## 2023-03-06 ENCOUNTER — Ambulatory Visit: Payer: 59 | Admitting: Sports Medicine

## 2023-08-21 ENCOUNTER — Ambulatory Visit: Payer: BC Managed Care – PPO | Admitting: Family Medicine

## 2023-08-21 ENCOUNTER — Other Ambulatory Visit: Payer: Self-pay

## 2023-08-21 VITALS — BP 130/78 | HR 81 | Ht 72.0 in | Wt 182.0 lb

## 2023-08-21 DIAGNOSIS — M76891 Other specified enthesopathies of right lower limb, excluding foot: Secondary | ICD-10-CM | POA: Diagnosis not present

## 2023-08-21 DIAGNOSIS — G8929 Other chronic pain: Secondary | ICD-10-CM | POA: Diagnosis not present

## 2023-08-21 DIAGNOSIS — M25561 Pain in right knee: Secondary | ICD-10-CM

## 2023-08-21 NOTE — Patient Instructions (Addendum)
Thank you for coming in today.   Please use Voltaren gel (Generic Diclofenac Gel) up to 4x daily for pain as needed.  This is available over-the-counter as both the name brand Voltaren gel and the generic diclofenac gel.   Please complete the exercises that the athletic trainer went over with you: View at [email or web address] using code: [id number]   If not better we can add physical therapy if not improving

## 2023-08-21 NOTE — Progress Notes (Signed)
   Rubin Payor, PhD, LAT, ATC acting as a scribe for Clementeen Graham, MD.  Paul Strong is a 37 y.o. male who presents to [facility] today for R knee pain x 2-3 months. Insidious onset. He has been doing leg workouts; squats, lunges etc. Pt locates pain to the anterior aspect of his R knee, superior to the patella.   R Knee swelling: no Mechanical symptoms: no Aggravates: increased activity Treatments tried: ice, compression sleeve, heat, rest  Pertinent review of systems: No fevers or chills  Relevant historical information: Hypertension.  Patient works as an Sport and exercise psychologist.   Exam:  BP 130/78   Pulse 81   Ht 6' (1.829 m)   Wt 182 lb (82.6 kg)   SpO2 96%   BMI 24.68 kg/m  General: Well Developed, well nourished, and in no acute distress.   MSK: Right knee normal-appearing Normal motion without crepitation. Tender palpation superior portion of patella. Stable ligamentous exam. Negative McMurray's test. Intact strength.     Diagnostic Limited MSK Ultrasound of: Right knee Quad tendon is intact.  Some calcification present at distal quadriceps tendon at the patellar insertion site.  This is the area of tenderness with palpation with ultrasound probe. No significant increased vascular activity on Doppler. Patellar tendon is normal-appearing. Medial and lateral joint lines are normal-appearing. Posterior knee no Baker's cyst. Impression: Distal calcific quadricep tendinitis     Assessment and Plan: 37 y.o. male with pain thought to be due to chronic calcific quadricep tendinitis.  Plan for Voltaren gel and eccentric exercises taught in clinic today.  Will add formal physical therapy or nitroglycerin patch protocol if needed with a phone call or MyChart message.  Consider shockwave if that does not work.   PDMP not reviewed this encounter. Orders Placed This Encounter  Procedures   [place] LIMITED JOINT SPACE STRUCTURES LOW RIGHT(NO  LINKED CHARGES)    Order Specific Question:   Reason for Exam (SYMPTOM  OR DIAGNOSIS REQUIRED)    Answer:   right knee pain    Order Specific Question:   Preferred imaging location?    Answer:   [facility]   No orders of the defined types were placed in this encounter.    Discussed warning signs or symptoms. Please see discharge instructions. Patient expresses understanding.   The above documentation has been reviewed and is accurate and complete Clementeen Graham, M.D.

## 2023-09-02 ENCOUNTER — Ambulatory Visit: Payer: 59 | Admitting: Sports Medicine

## 2023-09-04 ENCOUNTER — Ambulatory Visit: Payer: Self-pay | Admitting: Sports Medicine

## 2023-09-18 ENCOUNTER — Ambulatory Visit: Payer: 59 | Admitting: Family Medicine

## 2023-10-27 ENCOUNTER — Encounter: Payer: Self-pay | Admitting: Family Medicine

## 2023-10-27 ENCOUNTER — Other Ambulatory Visit: Payer: Self-pay

## 2023-10-27 DIAGNOSIS — G8929 Other chronic pain: Secondary | ICD-10-CM
